# Patient Record
Sex: Female | Born: 1955 | Race: White | Hispanic: No | Marital: Married | Smoking: Former smoker
Health system: Southern US, Community
[De-identification: ages and names within clinical notes are randomized; demographics above are authoritative.]

## PROBLEM LIST (undated history)

## (undated) HISTORY — PX: BLADDER SUSPENSION: SHX72

---

## 2010-11-27 ENCOUNTER — Inpatient Hospital Stay (INDEPENDENT_AMBULATORY_CARE_PROVIDER_SITE_OTHER)
Admission: RE | Admit: 2010-11-27 | Discharge: 2010-11-27 | Disposition: A | Discharge status: Home / Self Care | Payer: PRIVATE HEALTH INSURANCE | Source: Ambulatory Visit | Attending: Family Medicine | Admitting: Family Medicine

## 2010-11-27 ENCOUNTER — Ambulatory Visit
Admission: RE | Admit: 2010-11-27 | Discharge: 2010-11-27 | Disposition: A | Discharge status: Home / Self Care | Payer: PRIVATE HEALTH INSURANCE | Source: Ambulatory Visit | Attending: Family Medicine | Admitting: Family Medicine

## 2010-11-27 ENCOUNTER — Encounter: Payer: Self-pay | Admitting: Family Medicine

## 2010-11-27 ENCOUNTER — Other Ambulatory Visit: Payer: Self-pay | Admitting: Family Medicine

## 2010-11-27 DIAGNOSIS — S90129A Contusion of unspecified lesser toe(s) without damage to nail, initial encounter: Secondary | ICD-10-CM

## 2010-11-27 DIAGNOSIS — S92919A Unspecified fracture of unspecified toe(s), initial encounter for closed fracture: Secondary | ICD-10-CM

## 2010-12-04 ENCOUNTER — Telehealth (INDEPENDENT_AMBULATORY_CARE_PROVIDER_SITE_OTHER): Payer: Self-pay | Admitting: Emergency Medicine

## 2011-07-02 NOTE — Progress Notes (Signed)
Summary: LEFT FOOT POSSIBLE FRATURE? (rm 5)   Vital Signs:  Patient Profile:   55 Years Old Female CC:      left 5th toe pain x 5 days Height:     66 inches Weight:      156 pounds O2 Sat:      99 % O2 treatment:    Room Air Temp:     97.8 degrees F oral Pulse rate:   65 / minute Resp:     16 per minute BP sitting:   135 / 74  (left arm) Cuff size:   regular  Vitals Entered By: Lajean Saver RN (November 27, 2010 11:03 AM)                  Updated Prior Medication List: No Medications Current Allergies: No known allergies History of Present Illness Chief Complaint: left 5th toe pain x 5 days History of Present Illness:  Subjective:  Patient complains of jamming her left 5th toe on some furniture 5 days ago, and today her dog jumped on it.  She has had persistent pain in her toe when walking.  Initial bruising resolved.  REVIEW OF SYSTEMS Constitutional Symptoms      Denies fever, chills, night sweats, weight loss, weight gain, and fatigue.  Eyes       Denies change in vision, eye pain, eye discharge, glasses, contact lenses, and eye surgery. Ear/Nose/Throat/Mouth       Denies hearing loss/aids, change in hearing, ear pain, ear discharge, dizziness, frequent runny nose, frequent nose bleeds, sinus problems, sore throat, hoarseness, and tooth pain or bleeding.  Respiratory       Denies dry cough, productive cough, wheezing, shortness of breath, asthma, bronchitis, and emphysema/COPD.  Cardiovascular       Denies murmurs, chest pain, and tires easily with exhertion.    Gastrointestinal       Denies stomach pain, nausea/vomiting, diarrhea, constipation, blood in bowel movements, and indigestion. Genitourniary       Denies painful urination, kidney stones, and loss of urinary control. Neurological       Denies paralysis, seizures, and fainting/blackouts. Musculoskeletal       Complains of joint pain, joint stiffness, decreased range of motion, and swelling.      Denies  muscle pain, redness, muscle weakness, and gout.      Comments: left little toe Skin       Denies bruising, unusual mles/lumps or sores, and hair/skin or nail changes.  Psych       Denies mood changes, temper/anger issues, anxiety/stress, speech problems, depression, and sleep problems. Other Comments: Patient reports jamming her left 5th toe into furniture about 5 days ago. It has continued to swell and increase in pain. She is unable to felx her left foot when walking   Past History:  Past Medical History: Unremarkable  Past Surgical History: Denies surgical history  Social History: Married Never Smoked Alcohol use-yes 1/day Drug use-no Smoking Status:  never Drug Use:  no   Objective:  Appearance:  Patient appears healthy, stated age, and in no acute distress  Left foot:  No swelling or deformity.  Good range of motion all toes.  Distinct tenderness over the 5th MTP joint and proximal 5th phalanx.  Distal neurovascular intact.  No ecchymosis. X-ray left 5th toe:   IMPRESSION: Fifth proximal phalangeal fracture. Assessment New Problems: FRACTURE, TOE, LEFT (ICD-826.0) CONTUSION OF TOE (ICD-924.3)   Plan New Orders: T-DG Toe 5th*L* [73660] New Patient Level III [  99203] Planning Comments:   Buddy tape toe until healed.  Ibuprofen for pain. Recommend Calcium supplement with vitamin D (info sheet given).  Avoid running, etc.  (ellipticle, bicycle exercisers OK)   The patient and/or caregiver has been counseled thoroughly with regard to medications prescribed including dosage, schedule, interactions, rationale for use, and possible side effects and they verbalize understanding.  Diagnoses and expected course of recovery discussed and will return if not improved as expected or if the condition worsens. Patient and/or caregiver verbalized understanding.   Orders Added: 1)  T-DG Toe 5th*L* [73660] 2)  New Patient Level III [16109]

## 2011-07-02 NOTE — Telephone Encounter (Signed)
  Phone Note Outgoing Call   Call placed by: Lavell Islam RN,  Dec 04, 2010 11:14 AM Call placed to: Patient Action Taken: Phone Call Completed Summary of Call: Spoke with patient who says her toe is much improved. Initial call taken by: Lavell Islam RN,  Dec 04, 2010 11:15 AM

## 2012-11-08 IMAGING — CR DG TOE 5TH 2+V*L*
1 series · 1 of 1 positions shown · non-contrast
Comparison: None.

CLINICAL DATA: Fifth toe pain.

LEFT TOE - 2+ VIEW

[view not recorded]
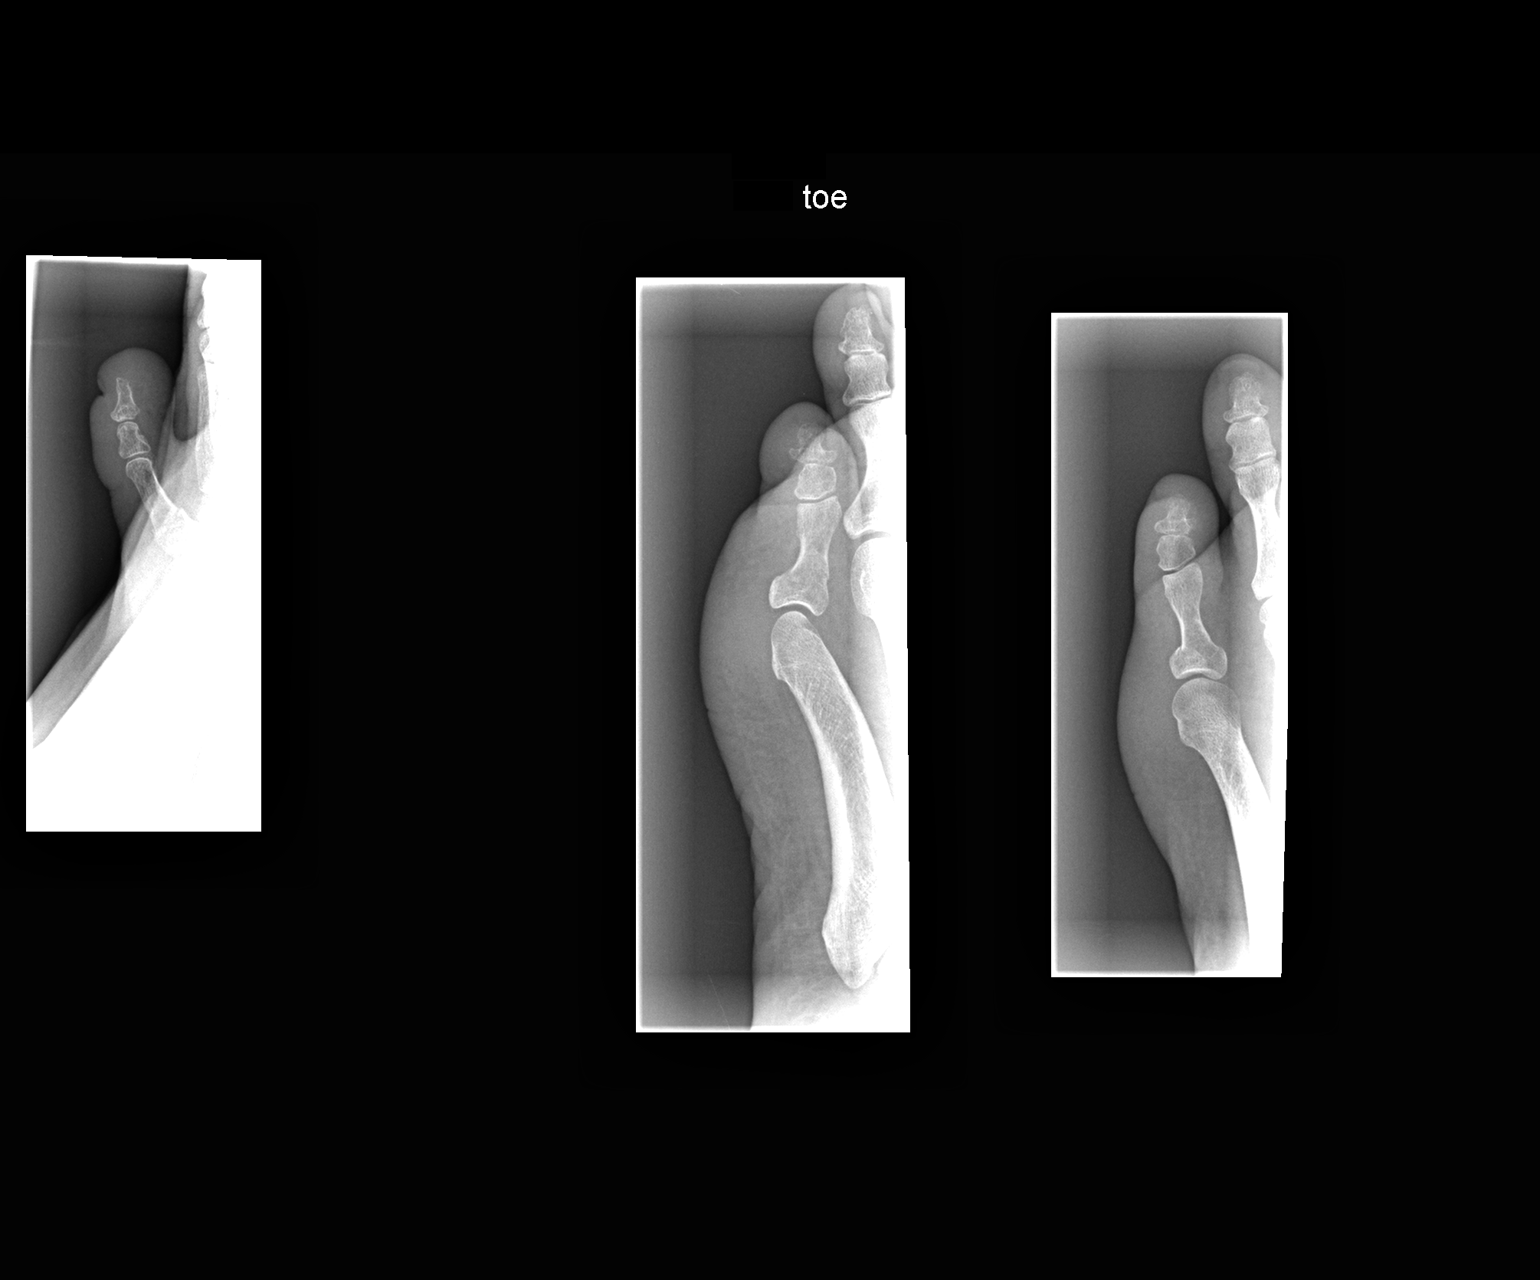

[1 of 1 positions shown; findings below may reference images not displayed]

FINDINGS: There is a nondisplaced metaphyseal fracture at the base
of the fifth proximal phalanx.  Fracture line does not appear to
extend to the metatarsal phalangeal joint, although it is difficult
to exclude with complete certainty.
IMPRESSION: Fifth proximal phalangeal fracture.

## 2016-09-14 ENCOUNTER — Encounter (HOSPITAL_BASED_OUTPATIENT_CLINIC_OR_DEPARTMENT_OTHER): Payer: Self-pay | Admitting: *Deleted

## 2016-09-14 ENCOUNTER — Emergency Department (HOSPITAL_BASED_OUTPATIENT_CLINIC_OR_DEPARTMENT_OTHER): Admission: EM | Admit: 2016-09-14 | Payer: PRIVATE HEALTH INSURANCE

## 2021-01-04 ENCOUNTER — Emergency Department
Admission: RE | Admit: 2021-01-04 | Discharge: 2021-01-04 | Disposition: A | Discharge status: Home / Self Care | Payer: PRIVATE HEALTH INSURANCE | Source: Ambulatory Visit | Attending: Internal Medicine | Admitting: Internal Medicine

## 2021-01-04 ENCOUNTER — Other Ambulatory Visit: Payer: Self-pay

## 2021-01-04 VITALS — BP 147/92 | HR 77 | Temp 98.7°F | Resp 16 | Ht 65.0 in | Wt 170.0 lb

## 2021-01-04 DIAGNOSIS — J040 Acute laryngitis: Secondary | ICD-10-CM

## 2021-01-04 DIAGNOSIS — B9789 Other viral agents as the cause of diseases classified elsewhere: Secondary | ICD-10-CM | POA: Diagnosis not present

## 2021-01-04 MED ORDER — FLUTICASONE PROPIONATE 50 MCG/ACT NA SUSP: 1.0000 | Freq: Every day | NASAL | 0 refills | Status: AC

## 2021-01-04 NOTE — Discharge Instructions (Addendum)
Increase oral fluid intake We will call you with recommendations if labs are abnormal Tylenol or Motrin as needed for pain Warm salt water gargle Return to urgent care if your symptoms worsen.

## 2021-01-04 NOTE — ED Provider Notes (Signed)
[facility]    CSN: [id number] Arrival date & time: 01/04/21  1533      History   Chief Complaint Chief Complaint  Patient presents with  . Facial Pain    Sinus congestion     HPI Jessica Scott is a 65 y.o. female comes to urgent care with 3-day history of nasal congestion, bloody nasal discharge, sore throat and hoarseness of voice.  Patient did some yard work including power washing of headache over the weekend.  Following that patient started experiencing nasal congestion and discomfort over the face as well as a sore throat.  Patient had subjective fever.  No shortness of breath.  She has nonproductive cough with some chest congestion.  Patient's husband is being treated for acute sinusitis.  He tested negative for COVID-19.  Patient is not vaccinated against COVID-19 virus.  No nausea, vomiting or diarrhea   HPI  History reviewed. No pertinent past medical history.  There are no problems to display for this patient.   Past Surgical History:  Procedure Laterality Date  . BLADDER SUSPENSION      OB History   No obstetric history on file.      Home Medications    Prior to Admission medications   Medication Sig Start Date End Date Taking? Authorizing Provider  Ascorbic Acid (VITAMIN C ADULT GUMMIES PO) Take by mouth.   Yes [provider]  fluticasone (FLONASE) 50 MCG/ACT nasal spray Place 1 spray into both nostrils daily. 01/04/21  Yes Antione Obar, Britta Mccreedy, MD  Cholecalciferol 25 MCG (1000 UT) tablet Take by mouth.    [provider]  Phytonadione (MEPHYTON PO) vitamin K    [provider]  albuterol (PROVENTIL HFA;VENTOLIN HFA) 108 (90 Base) MCG/ACT inhaler Inhale into the lungs every 6 (six) hours as needed for wheezing or shortness of breath. Patient not taking: Reported on 01/04/2021  01/04/21  [provider]  metFORMIN (GLUCOPHAGE) 500 MG tablet Take by mouth 2 (two) times daily with a meal. Patient not taking: Reported on  01/04/2021  01/04/21  [provider]    Family History Family History  Problem Relation Age of Onset  . Heart failure Mother   . Throat cancer Father     Social History Social History   Tobacco Use  . Smoking status: Former Smoker    Quit date: 2011    Years since quitting: 11.4  . Smokeless tobacco: Never Used  Vaping Use  . Vaping Use: Never used  Substance Use Topics  . Alcohol use: Yes    Alcohol/week: 7.0 standard drinks    Types: 7 Glasses of wine per week  . Drug use: Never     Allergies   Penicillins   Review of Systems Review of Systems  Constitutional: Negative.   HENT: Positive for sore throat and voice change. Negative for ear discharge and ear pain.   Respiratory: Positive for cough. Negative for chest tightness and wheezing.   Cardiovascular: Negative for chest pain.  Genitourinary: Negative.   Neurological: Negative for headaches.     Physical Exam Triage Vital Signs ED Triage Vitals  Enc Vitals Group     BP 01/04/21 1555 (!) 147/92     Pulse Rate 01/04/21 1555 77     Resp 01/04/21 1555 16     Temp 01/04/21 1555 98.7 F (37.1 C)     Temp Source 01/04/21 1555 Oral     SpO2 01/04/21 1555 98 %     Weight 01/04/21  1600 170 lb (77.1 kg)     Height 01/04/21 1600 5\' 5"  (1.651 m)     Head Circumference --      Peak Flow --      Pain Score 01/04/21 1559 4     Pain Loc --      Pain Edu? --      Excl. in GC? --    No data found.  Updated Vital Signs BP (!) 147/92 (BP Location: Right Arm)   Pulse 77   Temp 98.7 F (37.1 C) (Oral)   Resp 16   Ht 5\' 5"  (1.651 m)   Wt 77.1 kg   SpO2 98%   BMI 28.29 kg/m   Visual Acuity Right Eye Distance:   Left Eye Distance:   Bilateral Distance:    Right Eye Near:   Left Eye Near:    Bilateral Near:     Physical Exam Vitals and nursing note reviewed.  Constitutional:      General: She is not in acute distress.    Appearance: She is not ill-appearing.  HENT:     Right Ear: Tympanic  membrane normal.     Left Ear: Tympanic membrane normal.  Cardiovascular:     Rate and Rhythm: Normal rate and regular rhythm.     Pulses: Normal pulses.     Heart sounds: Normal heart sounds.  Pulmonary:     Effort: Pulmonary effort is normal.     Breath sounds: Normal breath sounds.  Abdominal:     General: Bowel sounds are normal.     Palpations: Abdomen is soft.  Musculoskeletal:        General: Normal range of motion.  Neurological:     Mental Status: She is alert.      UC Treatments / Results  Labs (all labs ordered are listed, but only abnormal results are displayed) Labs Reviewed  NOVEL CORONAVIRUS, NAA    EKG   Radiology No results found.  Procedures Procedures (including critical care time)  Medications Ordered in UC Medications - No data to display  Initial Impression / Assessment and Plan / UC Course  I have reviewed the triage vital signs and the nursing notes.  Pertinent labs & imaging results that were available during my care of the patient were reviewed by me and considered in my medical decision making (see chart for details).     1.  Acute laryngitis (irritant related versus virus) COVID-19 PCR test sent Increase oral fluid intake Warm salt water gargle Tylenol/Motrin as needed for pain Return to urgent care if symptoms worsen We will call you with recommendations if labs are abnormal. Final Clinical Impressions(s) / UC Diagnoses   Final diagnoses:  Viral laryngitis     Discharge Instructions     Increase oral fluid intake We will call you with recommendations if labs are abnormal Tylenol or Motrin as needed for pain Warm salt water gargle Return to urgent care if your symptoms worsen.   ED Prescriptions    Medication Sig Dispense Auth. Provider   fluticasone (FLONASE) 50 MCG/ACT nasal spray Place 1 spray into both nostrils daily. 16 g Korinna Tat, 03/06/21, MD     PDMP not reviewed this encounter.   ,  MD 01/04/21 [phone]

## 2021-01-04 NOTE — ED Triage Notes (Signed)
Sinus congestion since Monday  Pt has been out in the yard last week  Increased fatigue the last 2 days  No COVID vaccine

## 2021-01-05 LAB — NOVEL CORONAVIRUS, NAA: SARS-CoV-2, NAA: NOT DETECTED

## 2021-01-05 LAB — SARS-COV-2, NAA 2 DAY TAT
